# Patient Record
Sex: Male | Born: 1965 | Race: White | Hispanic: No | Marital: Single | Smoking: Current every day smoker
Health system: Southern US, Community
[De-identification: ages and names within clinical notes are randomized; demographics above are authoritative.]

---

## 2009-05-27 ENCOUNTER — Emergency Department (HOSPITAL_COMMUNITY): Admission: EM | Admit: 2009-05-27 | Discharge: 2009-05-28 | Payer: Self-pay | Admitting: Emergency Medicine

## 2013-11-12 ENCOUNTER — Other Ambulatory Visit (HOSPITAL_COMMUNITY): Payer: Self-pay | Admitting: Family Medicine

## 2013-11-12 ENCOUNTER — Ambulatory Visit (HOSPITAL_COMMUNITY)
Admission: RE | Admit: 2013-11-12 | Discharge: 2013-11-12 | Disposition: A | Discharge status: Home / Self Care | Payer: Disability Insurance | Source: Ambulatory Visit | Attending: Family Medicine | Admitting: Family Medicine

## 2013-11-12 DIAGNOSIS — M545 Low back pain, unspecified: Secondary | ICD-10-CM | POA: Insufficient documentation

## 2013-11-12 DIAGNOSIS — M79609 Pain in unspecified limb: Secondary | ICD-10-CM | POA: Insufficient documentation

## 2013-11-12 DIAGNOSIS — S32009A Unspecified fracture of unspecified lumbar vertebra, initial encounter for closed fracture: Secondary | ICD-10-CM | POA: Diagnosis not present

## 2013-11-12 DIAGNOSIS — R52 Pain, unspecified: Secondary | ICD-10-CM

## 2013-11-12 DIAGNOSIS — X58XXXA Exposure to other specified factors, initial encounter: Secondary | ICD-10-CM | POA: Diagnosis not present

## 2013-11-12 DIAGNOSIS — M5137 Other intervertebral disc degeneration, lumbosacral region: Secondary | ICD-10-CM | POA: Insufficient documentation

## 2013-11-12 DIAGNOSIS — M51379 Other intervertebral disc degeneration, lumbosacral region without mention of lumbar back pain or lower extremity pain: Secondary | ICD-10-CM | POA: Insufficient documentation

## 2017-03-27 ENCOUNTER — Emergency Department (HOSPITAL_COMMUNITY): Payer: Self-pay

## 2017-03-27 ENCOUNTER — Encounter (HOSPITAL_COMMUNITY): Payer: Self-pay | Admitting: Cardiology

## 2017-03-27 ENCOUNTER — Emergency Department (HOSPITAL_COMMUNITY)
Admission: EM | Admit: 2017-03-27 | Discharge: 2017-03-27 | Disposition: A | Discharge status: Home / Self Care | Payer: Self-pay | Attending: Emergency Medicine | Admitting: Emergency Medicine

## 2017-03-27 DIAGNOSIS — Y939 Activity, unspecified: Secondary | ICD-10-CM | POA: Insufficient documentation

## 2017-03-27 DIAGNOSIS — F17200 Nicotine dependence, unspecified, uncomplicated: Secondary | ICD-10-CM | POA: Insufficient documentation

## 2017-03-27 DIAGNOSIS — Y999 Unspecified external cause status: Secondary | ICD-10-CM | POA: Insufficient documentation

## 2017-03-27 DIAGNOSIS — S62631B Displaced fracture of distal phalanx of left index finger, initial encounter for open fracture: Secondary | ICD-10-CM

## 2017-03-27 DIAGNOSIS — W231XXA Caught, crushed, jammed, or pinched between stationary objects, initial encounter: Secondary | ICD-10-CM | POA: Insufficient documentation

## 2017-03-27 DIAGNOSIS — Y929 Unspecified place or not applicable: Secondary | ICD-10-CM | POA: Insufficient documentation

## 2017-03-27 DIAGNOSIS — S61211A Laceration without foreign body of left index finger without damage to nail, initial encounter: Secondary | ICD-10-CM | POA: Insufficient documentation

## 2017-03-27 DIAGNOSIS — S62631A Displaced fracture of distal phalanx of left index finger, initial encounter for closed fracture: Secondary | ICD-10-CM | POA: Insufficient documentation

## 2017-03-27 MED ORDER — LIDOCAINE HCL (PF) 2 % IJ SOLN: 10.0000 mL | Freq: Once | INTRAMUSCULAR | Status: DC

## 2017-03-27 MED ORDER — LIDOCAINE HCL (PF) 1 % IJ SOLN
INTRAMUSCULAR | Status: AC
  Filled 2017-03-27: qty 20

## 2017-03-27 MED ORDER — HYDROCODONE-ACETAMINOPHEN 5-325 MG PO TABS: 2.0000 | ORAL_TABLET | ORAL | 0 refills | Status: DC | PRN

## 2017-03-27 MED ORDER — SULFAMETHOXAZOLE-TRIMETHOPRIM 800-160 MG PO TABS
1.0000 | ORAL_TABLET | Freq: Once | ORAL | Status: AC
  Administered 2017-03-27: 1 via ORAL
  Filled 2017-03-27: qty 1

## 2017-03-27 MED ORDER — SULFAMETHOXAZOLE-TRIMETHOPRIM 800-160 MG PO TABS: 1.0000 | ORAL_TABLET | Freq: Two times a day (BID) | ORAL | 0 refills | Status: AC

## 2017-03-27 MED ORDER — HYDROCODONE-ACETAMINOPHEN 5-325 MG PO TABS
2.0000 | ORAL_TABLET | Freq: Once | ORAL | Status: AC
  Administered 2017-03-27: 2 via ORAL
  Filled 2017-03-27: qty 2

## 2017-03-27 NOTE — ED Triage Notes (Signed)
Caught left pointer finger in between 2 pieces of metal.

## 2017-03-27 NOTE — Discharge Instructions (Signed)
See Dr. Romeo AppleHarrison for recheck in 2-3 days.  Suture removal in 8 days

## 2017-03-28 NOTE — ED Provider Notes (Signed)
[facility] EMERGENCY DEPARTMENT Provider Note   CSN: [id number] Arrival date & time: 03/27/17  1559     History   Chief Complaint Chief Complaint  Patient presents with  . Finger Injury    HPI Clarence Sweeney is a 52 y.o. male.  The history is provided by the patient. No language interpreter was used.  Hand Pain  This is a new problem. The current episode started less than 1 hour ago. The problem occurs constantly. The problem has not changed since onset.Nothing aggravates the symptoms. Nothing relieves the symptoms. He has tried nothing for the symptoms. The treatment provided no relief.  Pt crushed finger between 2 pieces of metal.     History reviewed. No pertinent past medical history.  There are no active problems to display for this patient.   History reviewed. No pertinent surgical history.     Home Medications    Prior to Admission medications   Medication Sig Start Date End Date Taking? Authorizing Provider  HYDROcodone-acetaminophen (NORCO/VICODIN) 5-325 MG tablet Take 2 tablets by mouth every 4 (four) hours as needed. 03/27/17   Elson AreasSofia, Raegen Tarpley K, PA-C  sulfamethoxazole-trimethoprim (BACTRIM DS,SEPTRA DS) 800-160 MG tablet Take 1 tablet by mouth 2 (two) times daily for 7 days. 03/27/17 04/03/17  Elson AreasSofia, Hyacinth Marcelli K, PA-C    Family History History reviewed. No pertinent family history.  Social History Social History   Tobacco Use  . Smoking status: Current Every Day Smoker  . Smokeless tobacco: Never Used  Substance Use Topics  . Alcohol use: Yes    Comment: beer  everyday   . Drug use: Yes    Types: Marijuana    Comment: marijuana last night      Allergies   Patient has no known allergies.   Review of Systems Review of Systems  All other systems reviewed and are negative.    Physical Exam Updated Vital Signs BP 137/88   Pulse (!) 110   Temp 98.4 F (36.9 C)   Resp 16   Ht 5\' 11"  (1.803 m)   Wt 79.4 kg (175 lb)   SpO2 97%   BMI 24.41 kg/m     Physical Exam  Constitutional: He appears well-developed and well-nourished.  HENT:  Head: Normocephalic.  Eyes: Pupils are equal, round, and reactive to light.  Musculoskeletal: He exhibits tenderness.  laceration left index finger gapping,  Swollen,  From  Normal sensation   Neurological: He is alert.  Skin: Skin is warm.  Psychiatric: He has a normal mood and affect.  Nursing note and vitals reviewed.    ED Treatments / Results  Labs (all labs ordered are listed, but only abnormal results are displayed) Labs Reviewed - No data to display  EKG  EKG Interpretation None       Radiology Dg Finger Index Left  Result Date: 03/27/2017 CLINICAL DATA:  Crush injury left index finger between 2 pieces of metal today. Initial encounter. EXAM: LEFT INDEX FINGER 2+V COMPARISON:  None. FINDINGS: Comminuted fracture of the distal phalanx of the index finger is identified. The fracture spares the proximal 0.7 cm of the distal phalanx. Main fracture fragment shows slight volar angulation. There is soft tissue gas and irregularity present compatible with an open fracture. IMPRESSION: Comminuted fracture of the distal phalanx of the left index finger spares the proximal 0.7 cm. The fracture appears to be open with soft tissue wound and gas identified. Electronically Signed   By: Drusilla Kannerhomas  Dalessio M.D.   On: 03/27/2017 16:39  Procedures .Marland KitchenLaceration Repair Date/Time: 03/28/2017 4:18 PM Performed by: Elson Areas, PA-C Authorized by: Elson Areas, PA-C   Consent:    Consent obtained:  Verbal   Consent given by:  Patient   Risks discussed:  Infection and need for additional repair   Alternatives discussed:  Referral Anesthesia (see MAR for exact dosages):    Anesthesia method:  Nerve block   Block location:  Digital   Block needle gauge:  27 G   Block anesthetic:  Lidocaine 1% w/o epi   Block injection procedure:  Anatomic landmarks identified Laceration details:    Location:   Finger   Length (cm):  1.4   Depth (mm):  3 Repair type:    Repair type:  Simple Pre-procedure details:    Preparation:  Patient was prepped and draped in usual sterile fashion Exploration:    Wound extent: underlying fracture     Contaminated: no   Treatment:    Area cleansed with:  Betadine   Irrigation solution:  Sterile saline   Irrigation method:  Syringe   Visualized foreign bodies/material removed: no   Skin repair:    Repair method:  Sutures   Suture size:  5-0   Suture technique:  Simple interrupted   Number of sutures:  4 Approximation:    Approximation:  Loose   Vermilion border: well-aligned   Post-procedure details:    Dressing:  Non-adherent dressing   Patient tolerance of procedure:  Tolerated well, no immediate complications   (including critical care time)  Medications Ordered in ED Medications  HYDROcodone-acetaminophen (NORCO/VICODIN) 5-325 MG per tablet 2 tablet (2 tablets Oral Given 03/27/17 1734)  sulfamethoxazole-trimethoprim (BACTRIM DS,SEPTRA DS) 800-160 MG per tablet 1 tablet (1 tablet Oral Given 03/27/17 1734)     Initial Impression / Assessment and Plan / ED Course  I have reviewed the triage vital signs and the nursing notes.  Pertinent labs & imaging results that were available during my care of the patient were reviewed by me and considered in my medical decision making (see chart for details).     MDM  Pt counseled on open fracture and need for antibiotics as well as follow up with Orthopaedist for evaluation.  Pt referred to Dr. Romeo Apple for follow up.  Pt placed in a splint to cover wound.   Final Clinical Impressions(s) / ED Diagnoses   Final diagnoses:  Open displaced fracture of distal phalanx of left index finger, initial encounter    ED Discharge Orders        Ordered    HYDROcodone-acetaminophen (NORCO/VICODIN) 5-325 MG tablet  Every 4 hours PRN     03/27/17 1726    sulfamethoxazole-trimethoprim (BACTRIM DS,SEPTRA DS) 800-160  MG tablet  2 times daily     03/27/17 1726    An After Visit Summary was printed and given to the patient.   Elson Areas, [place] 03/28/17 1621    Mancel Bale, MD 03/29/17 [phone]

## 2017-04-01 ENCOUNTER — Telehealth: Payer: Self-pay | Admitting: Orthopaedic Surgery

## 2017-04-01 NOTE — Telephone Encounter (Signed)
Patient (also had his mother call, today, 04/01/17) requests appointment following Emergency room visit at [facility] on 03/28/17 for problem:  "Open displaced fracture of distal phalanx of left index finger"   - please review and advise.

## 2017-04-02 NOTE — Telephone Encounter (Signed)
Injury almost a week old.  Reasons for delay?? Schedule today.

## 2017-04-02 NOTE — Telephone Encounter (Signed)
Patient is scheduled for appointment tomorrow, 04/03/17.

## 2017-04-02 NOTE — Telephone Encounter (Signed)
Have been trying to reach patient.

## 2017-04-03 ENCOUNTER — Ambulatory Visit (INDEPENDENT_AMBULATORY_CARE_PROVIDER_SITE_OTHER): Payer: Disability Insurance

## 2017-04-03 ENCOUNTER — Ambulatory Visit (INDEPENDENT_AMBULATORY_CARE_PROVIDER_SITE_OTHER): Payer: Self-pay | Admitting: Orthopaedic Surgery

## 2017-04-03 ENCOUNTER — Encounter: Payer: Self-pay | Admitting: Orthopaedic Surgery

## 2017-04-03 VITALS — BP 134/80 | HR 78 | Temp 97.6°F | Ht 71.0 in | Wt 183.0 lb

## 2017-04-03 DIAGNOSIS — S62631B Displaced fracture of distal phalanx of left index finger, initial encounter for open fracture: Secondary | ICD-10-CM

## 2017-04-03 MED ORDER — HYDROCODONE-ACETAMINOPHEN 5-325 MG PO TABS: ORAL_TABLET | ORAL | 0 refills | Status: DC

## 2017-04-03 NOTE — Patient Instructions (Signed)
Steps to Quit Smoking Smoking tobacco can be bad for your health. It can also affect almost every organ in your body. Smoking puts you and people around you at risk for many serious long-lasting (chronic) diseases. Quitting smoking is hard, but it is one of the best things that you can do for your health. It is never too late to quit. What are the benefits of quitting smoking? When you quit smoking, you lower your risk for getting serious diseases and conditions. They can include:  Lung cancer or lung disease.  Heart disease.  Stroke.  Heart attack.  Not being able to have children (infertility).  Weak bones (osteoporosis) and broken bones (fractures).  If you have coughing, wheezing, and shortness of breath, those symptoms may get better when you quit. You may also get sick less often. If you are pregnant, quitting smoking can help to lower your chances of having a baby of low birth weight. What can I do to help me quit smoking? Talk with your doctor about what can help you quit smoking. Some things you can do (strategies) include:  Quitting smoking totally, instead of slowly cutting back how much you smoke over a period of time.  Going to in-person counseling. You are more likely to quit if you go to many counseling sessions.  Using resources and support systems, such as: ? Online chats with a counselor. ? Phone quitlines. ? Printed self-help materials. ? Support groups or group counseling. ? Text messaging programs. ? Mobile phone apps or applications.  Taking medicines. Some of these medicines may have nicotine in them. If you are pregnant or breastfeeding, do not take any medicines to quit smoking unless your doctor says it is okay. Talk with your doctor about counseling or other things that can help you.  Talk with your doctor about using more than one strategy at the same time, such as taking medicines while you are also going to in-person counseling. This can help make  quitting easier. What things can I do to make it easier to quit? Quitting smoking might feel very hard at first, but there is a lot that you can do to make it easier. Take these steps:  Talk to your family and friends. Ask them to support and encourage you.  Call phone quitlines, reach out to support groups, or work with a counselor.  Ask people who smoke to not smoke around you.  Avoid places that make you want (trigger) to smoke, such as: ? Bars. ? Parties. ? Smoke-break areas at work.  Spend time with people who do not smoke.  Lower the stress in your life. Stress can make you want to smoke. Try these things to help your stress: ? Getting regular exercise. ? Deep-breathing exercises. ? Yoga. ? Meditating. ? Doing a body scan. To do this, close your eyes, focus on one area of your body at a time from head to toe, and notice which parts of your body are tense. Try to relax the muscles in those areas.  Download or buy apps on your mobile phone or tablet that can help you stick to your quit plan. There are many free apps, such as QuitGuide from the CDC (Centers for Disease Control and Prevention). You can find more support from smokefree.gov and other websites.  This information is not intended to replace advice given to you by your health care provider. Make sure you discuss any questions you have with your health care provider. Document Released: 12/01/2008 Document   Revised: 10/03/2015 Document Reviewed: 06/21/2014 Elsevier Interactive Patient Education  2018 Elsevier Inc.  

## 2017-04-03 NOTE — Progress Notes (Signed)
Subjective:    Patient ID: Clarence Sweeney, male    DOB: 08-15-1965, 52 y.o.   MRN: [id number]  HPI He was moving a cast iron furnace and he got his finger caught between it and the wall.  He hurt his left index finger.  This happened on 03-27-17.  He went to the ER.  X-rays were done.  He has open comminuted displaced fracture of the distal phalanx.  He had the wound cleansed and closed.  He has been on antibiotics.  He soaks the finger daily.  He has no redness or discharge.  He has no other injury.    I have reviewed the ER records, the x-rays and the x-ray reports.    Review of Systems  Musculoskeletal: Positive for arthralgias.  All other systems reviewed and are negative.  History reviewed. No pertinent past medical history.  History reviewed. No pertinent surgical history.  Current Outpatient Medications on File Prior to Visit  Medication Sig Dispense Refill  . sulfamethoxazole-trimethoprim (BACTRIM DS,SEPTRA DS) 800-160 MG tablet Take 1 tablet by mouth 2 (two) times daily for 7 days. 14 tablet 0  . HYDROcodone-acetaminophen (NORCO/VICODIN) 5-325 MG tablet Take 2 tablets by mouth every 4 (four) hours as needed. (Patient not taking: Reported on 04/03/2017) 10 tablet 0   No current facility-administered medications on file prior to visit.     Social History   Socioeconomic History  . Marital status: Single    Spouse name: Not on file  . Number of children: Not on file  . Years of education: Not on file  . Highest education level: Not on file  Social Needs  . Financial resource strain: Not on file  . Food insecurity - worry: Not on file  . Food insecurity - inability: Not on file  . Transportation needs - medical: Not on file  . Transportation needs - non-medical: Not on file  Occupational History  . Not on file  Tobacco Use  . Smoking status: Current Every Day Smoker  . Smokeless tobacco: Never Used  Substance and Sexual Activity  . Alcohol use: Yes    Comment: beer   everyday   . Drug use: Yes    Types: Marijuana    Comment: marijuana last night   . Sexual activity: Not on file  Other Topics Concern  . Not on file  Social History Narrative  . Not on file    History reviewed. No pertinent family history.  BP 134/80   Pulse 78   Temp 97.6 F (36.4 C)   Ht 5\' 11"  (1.803 m)   Wt 183 lb (83 kg)   BMI 25.52 kg/m      Objective:   Physical Exam  Constitutional: He is oriented to person, place, and time. He appears well-developed and well-nourished.  HENT:  Head: Normocephalic and atraumatic.  Eyes: Conjunctivae and EOM are normal. Pupils are equal, round, and reactive to light.  Neck: Normal range of motion. Neck supple.  Cardiovascular: Normal rate, regular rhythm and intact distal pulses.  Pulmonary/Chest: Effort normal.  Abdominal: Soft.  Musculoskeletal: He exhibits tenderness (Left index finger distally with some swelling, sutures on the dorsum and lateral, no redness, no purulence, hypersensitive, no drainage.).  Neurological: He is alert and oriented to person, place, and time. He has normal reflexes. He displays normal reflexes. No cranial nerve deficit. He exhibits normal muscle tone. Coordination normal.  Skin: Skin is warm and dry.  Psychiatric: He has a normal mood and affect. His  behavior is normal. Judgment and thought content normal.  Vitals reviewed.   X-rays were done of the left index finger, reported separately.      Assessment & Plan:   Encounter Diagnosis  Name Primary?  . Open displaced fracture of distal phalanx of left index finger, initial encounter Yes   A new sterile dressing and finger dressing applied.  Keep it on, do not remove.  Aluminum splint applied.  Return in one week.  I have reviewed the [place] Controlled Substance Reporting System web site prior to prescribing narcotic medicine for this patient.  Electronically Signed Darreld McleanWayne Alvis Edgell, MD 2/14/20198:32 AM

## 2017-04-10 ENCOUNTER — Encounter: Payer: Self-pay | Admitting: Orthopaedic Surgery

## 2017-04-10 ENCOUNTER — Ambulatory Visit: Payer: Self-pay | Admitting: Orthopaedic Surgery

## 2017-04-10 ENCOUNTER — Ambulatory Visit (INDEPENDENT_AMBULATORY_CARE_PROVIDER_SITE_OTHER): Payer: Disability Insurance

## 2017-04-10 VITALS — BP 122/75 | HR 85 | Temp 98.1°F | Ht 71.0 in | Wt 180.0 lb

## 2017-04-10 DIAGNOSIS — S62661D Nondisplaced fracture of distal phalanx of left index finger, subsequent encounter for fracture with routine healing: Secondary | ICD-10-CM

## 2017-04-10 NOTE — Progress Notes (Signed)
CC:  My finger is doing OK  He has been wearing the aluminum splint on the left index finger.  He has no problem.    The finger tip of the left index finger looks good.  There is no redness or drainage.  NV intact.  X-rays were done of the left index finger, reported separately.  Encounter Diagnosis  Name Primary?  . Open nondisplaced fracture of distal phalanx of left index finger with routine healing, subsequent encounter Yes   A new aluminum splint applied as well as new dressing.  Return in two weeks.  X-rays on return.  Call if any problem.  Precautions discussed.   Electronically Signed Darreld McleanWayne Manvi Guilliams, MD 2/21/20193:01 PM

## 2017-04-22 DIAGNOSIS — S62661B Nondisplaced fracture of distal phalanx of left index finger, initial encounter for open fracture: Secondary | ICD-10-CM | POA: Insufficient documentation

## 2017-04-23 ENCOUNTER — Encounter: Payer: Disability Insurance | Admitting: Orthopedic Surgery

## 2017-04-23 ENCOUNTER — Telehealth: Payer: Self-pay | Admitting: Orthopaedic Surgery

## 2017-04-23 ENCOUNTER — Other Ambulatory Visit: Payer: Disability Insurance

## 2017-04-23 NOTE — Telephone Encounter (Signed)
Patient called earlier this afternoon stating he was having trouble getting here for his appt @ 1:40 pm. I explained to him that it was very important to keep this appointment since he has a fracture.  I suggested to him that I could move his appt down to 4:20 pm giving him more time to find someone to bring him. I asked him to please give the office a call by 4 pm if he wasn't going to make it. He agreed to this and again I told him that with a fracture it was very important to keep this appointment, he stated he understood and would give me a call at 4 pm if he couldn't make it. It is now 4:30 pm, I tried calling him and got his voicemail. I left the message for him to call our office as soon as possible so we could reschedule this appointment for him.

## 2017-04-30 ENCOUNTER — Ambulatory Visit: Payer: Self-pay | Admitting: Orthopedic Surgery

## 2017-04-30 ENCOUNTER — Ambulatory Visit (INDEPENDENT_AMBULATORY_CARE_PROVIDER_SITE_OTHER): Payer: Disability Insurance

## 2017-04-30 DIAGNOSIS — S62661D Nondisplaced fracture of distal phalanx of left index finger, subsequent encounter for fracture with routine healing: Secondary | ICD-10-CM

## 2017-04-30 NOTE — Progress Notes (Signed)
Chief Complaint  Patient presents with  . Follow-up    Recheck on left index finger.    Recheck left index finger status post open fracture.    Only problem having now is stiffness.    Finger does not look infected in any way does have stiffness of the PIP and DIP joint.   Encounter Diagnosis  Name Primary?  . Open nondisplaced fracture of distal phalanx of left index finger with routine healing, subsequent encounter 03/27/17 Yes     Recommend soaks to warm the finger up and then active flexion exercises splint can be removed.  Follow-up has not been scheduled follow-up on an as-needed basis

## 2019-10-13 IMAGING — DX DG FINGER INDEX 2+V*L*
3 series · 3 of 3 positions shown · non-contrast
Comparison: None.

CLINICAL DATA: Crush injury left index finger between 2 pieces of
metal today. Initial encounter.

EXAM:
LEFT INDEX FINGER 2+V

[finger ap]
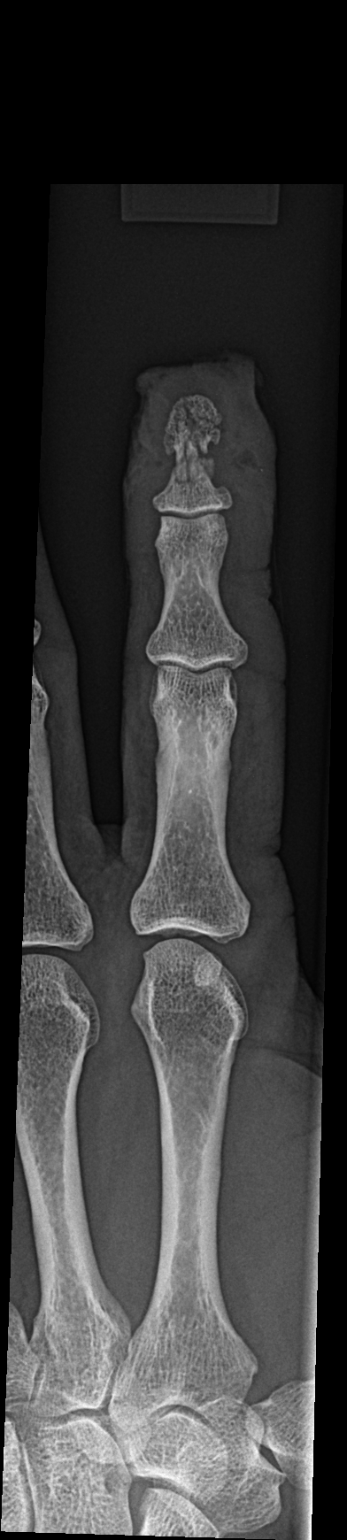

[finger obl]
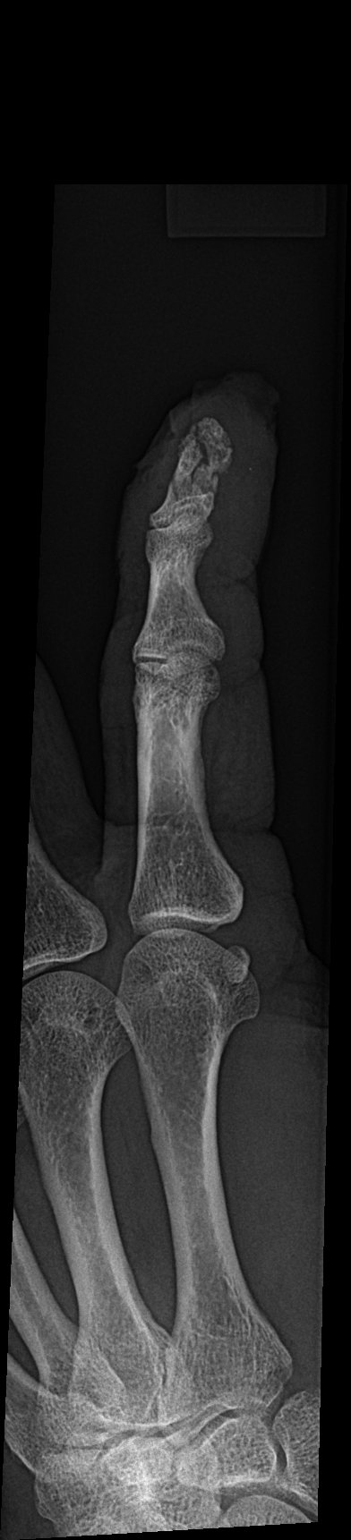

[finger lat]
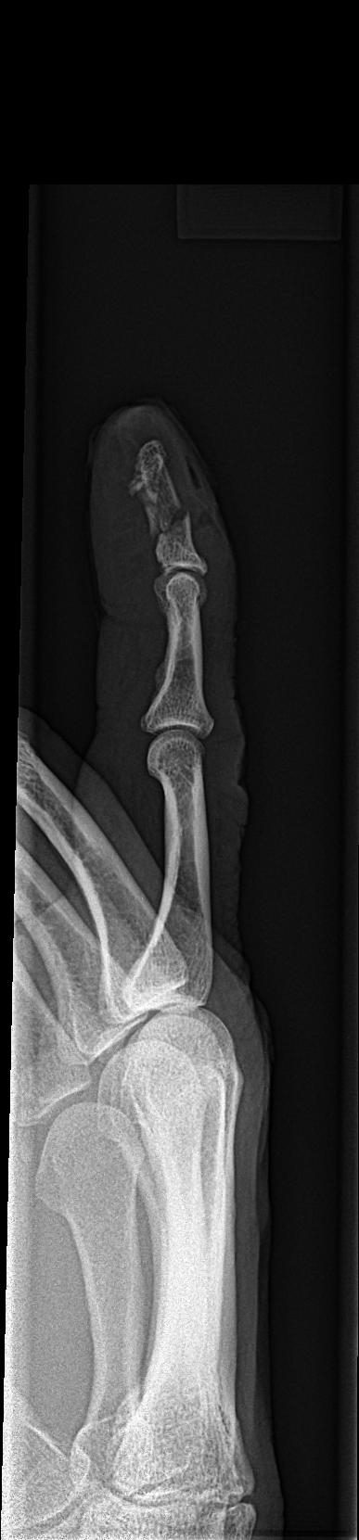

[3 of 3 positions shown; findings below may reference images not displayed]

FINDINGS: Comminuted fracture of the distal phalanx of the index finger is
identified. The fracture spares the proximal 0.7 cm of the distal
phalanx. Main fracture fragment shows slight volar angulation. There
is soft tissue gas and irregularity present compatible with an open
fracture.
IMPRESSION: Comminuted fracture of the distal phalanx of the left index finger
spares the proximal 0.7 cm. The fracture appears to be open with
soft tissue wound and gas identified.

## 2021-11-14 NOTE — Progress Notes (Signed)
No chief complaint on file.

## 2023-04-08 ENCOUNTER — Other Ambulatory Visit: Payer: Self-pay

## 2023-04-08 ENCOUNTER — Ambulatory Visit
Admission: EM | Admit: 2023-04-08 | Discharge: 2023-04-08 | Disposition: A | Discharge status: Home / Self Care | Payer: Self-pay | Attending: Nurse Practitioner | Admitting: Nurse Practitioner

## 2023-04-08 ENCOUNTER — Encounter: Payer: Self-pay | Admitting: Emergency Medicine

## 2023-04-08 DIAGNOSIS — J039 Acute tonsillitis, unspecified: Secondary | ICD-10-CM

## 2023-04-08 MED ORDER — DEXAMETHASONE SODIUM PHOSPHATE 10 MG/ML IJ SOLN
10.0000 mg | Freq: Once | INTRAMUSCULAR | Status: AC
  Administered 2023-04-08: 10 mg via INTRAMUSCULAR

## 2023-04-08 MED ORDER — PENICILLIN G BENZATHINE 1200000 UNIT/2ML IM SUSY
1.2000 10*6.[IU] | PREFILLED_SYRINGE | Freq: Once | INTRAMUSCULAR | Status: AC
  Administered 2023-04-08: 1.2 10*6.[IU] via INTRAMUSCULAR

## 2023-04-08 NOTE — ED Provider Notes (Signed)
 [facility]    CSN: [id number] Arrival date & time: 04/08/23  1528      History   Chief Complaint Chief Complaint  Patient presents with   Ear Pain    HPI Clarence Sweeney is a 58 y.o. male.   Patient presents today with 3 day history of left sided ear pain and sore throat.  He also reports headache and feeling like the left side of his face is swollen.  No fever, body aches, chills, cough, shortness of breath/chest pain.  No abdominal pain, nausea/vomiting or consistent diarrhea.  Reports it hurts to swallow and he has not been able to eat any solid food for 2 days.  He has been able to drink fluids well and swallow his saliva.  Has taken Benadryl and Tylenol for symptoms with minimal improvement.    History reviewed. No pertinent past medical history.  Patient Active Problem List   Diagnosis Date Noted   Open nondisplaced fracture of distal phalanx of left index finger 03/27/17 04/22/2017    History reviewed. No pertinent surgical history.     Home Medications    Prior to Admission medications   Not on File    Family History History reviewed. No pertinent family history.  Social History Social History   Tobacco Use   Smoking status: Every Day   Smokeless tobacco: Never  Substance Use Topics   Alcohol use: Yes    Comment: beer  everyday    Drug use: Yes    Types: Marijuana    Comment: marijuana last night      Allergies   Patient has no known allergies.   Review of Systems Review of Systems Per HPI  Physical Exam Triage Vital Signs ED Triage Vitals  Encounter Vitals Group     BP 04/08/23 1536 (!) 146/97     Systolic BP Percentile --      Diastolic BP Percentile --      Pulse Rate 04/08/23 1536 (!) 106     Resp 04/08/23 1536 20     Temp 04/08/23 1536 100.1 F (37.8 C)     Temp Source 04/08/23 1536 Oral     SpO2 04/08/23 1536 94 %     Weight --      Height --      Head Circumference --      Peak Flow --      Pain Score 04/08/23  1535 10     Pain Loc --      Pain Education --      Exclude from Growth Chart --    No data found.  Updated Vital Signs BP (!) 146/97 (BP Location: Right Arm)   Pulse (!) 106   Temp 100.1 F (37.8 C) (Oral)   Resp 20   SpO2 94%   Visual Acuity Right Eye Distance:   Left Eye Distance:   Bilateral Distance:    Right Eye Near:   Left Eye Near:    Bilateral Near:     Physical Exam Vitals and nursing note reviewed.  Constitutional:      General: He is not in acute distress.    Appearance: He is well-developed. He is not ill-appearing, toxic-appearing or diaphoretic.  HENT:     Head: Normocephalic and atraumatic.     Right Ear: Tympanic membrane and ear canal normal. No drainage, swelling or tenderness. No middle ear effusion. Tympanic membrane is not erythematous.     Left Ear: Tympanic membrane and ear canal normal.  No drainage, swelling or tenderness.  No middle ear effusion. Tympanic membrane is not erythematous.     Nose: No congestion or rhinorrhea.     Mouth/Throat:     Mouth: Mucous membranes are dry.     Pharynx: Pharyngeal swelling and posterior oropharyngeal erythema present. No oropharyngeal exudate or uvula swelling.     Tonsils: No tonsillar exudate or tonsillar abscesses. 0 on the right. 2+ on the left.  Eyes:     Conjunctiva/sclera: Conjunctivae normal.  Cardiovascular:     Rate and Rhythm: Regular rhythm. Tachycardia present.  Pulmonary:     Effort: Pulmonary effort is normal. No respiratory distress.     Breath sounds: Normal breath sounds. No wheezing, rhonchi or rales.  Musculoskeletal:     Cervical back: Neck supple.  Lymphadenopathy:     Cervical: Cervical adenopathy present.  Skin:    General: Skin is warm and dry.     Coloration: Skin is not pale.     Findings: No erythema or rash.  Neurological:     Mental Status: He is alert and oriented to person, place, and time.      UC Treatments / Results  Labs (all labs ordered are listed, but  only abnormal results are displayed) Labs Reviewed - No data to display  EKG   Radiology No results found.  Procedures Procedures (including critical care time)  Medications Ordered in UC Medications  penicillin g benzathine (BICILLIN LA) 1200000 UNIT/2ML injection 1.2 Million Units (1.2 Million Units Intramuscular Given 04/08/23 1620)  dexamethasone (DECADRON) injection 10 mg (10 mg Intramuscular Given 04/08/23 1620)    Initial Impression / Assessment and Plan / UC Course  I have reviewed the triage vital signs and the nursing notes.  Pertinent labs & imaging results that were available during my care of the patient were reviewed by me and considered in my medical decision making (see chart for details).   Patient is mildly hypertensive and slightly tachycardic in triage, otherwise vital signs are stable.    1. Acute tonsillitis, unspecified etiology Concern for early peritonsillar abscess Treat with IM Bicillin 1,200,000 units once, IM Decadron 10 mg IM once Recommended follow-up in ER if symptoms do not improve despite treatment Patient verbalizes understanding, all questions answered  The patient was given the opportunity to ask questions.  All questions answered to their satisfaction.  The patient is in agreement to this plan.   Final Clinical Impressions(s) / UC Diagnoses   Final diagnoses:  Acute tonsillitis, unspecified etiology     Discharge Instructions      We gave you an injection of antibiotic today to help with throat infection.  We also gave you a steroid shot to help with inflammation.  Please make sure you are drinking plenty of fluids.  If the pain worsens and you develop inability to swallow, please seek care in the emergency room.     ED Prescriptions   None    PDMP not reviewed this encounter.   Valentino Nose, NP 04/08/23 [phone]

## 2023-04-08 NOTE — Discharge Instructions (Addendum)
 We gave you an injection of antibiotic today to help with throat infection.  We also gave you a steroid shot to help with inflammation.  Please make sure you are drinking plenty of fluids.  If the pain worsens and you develop inability to swallow, please seek care in the emergency room.

## 2023-04-08 NOTE — ED Triage Notes (Signed)
Pt reports left ear pain and sore throat x 3 days.

## 2023-04-09 ENCOUNTER — Telehealth: Payer: Self-pay | Admitting: Emergency Medicine

## 2023-04-09 NOTE — Telephone Encounter (Signed)
 Pt called and reported throat pain has returned and has tried tylenol, ibuprofen. Pt inquiring about prescription, reviewed chart and discussed providers recommendations of being evaluated in ED. Pt verbalized understanding.

## 2023-04-10 ENCOUNTER — Encounter (HOSPITAL_COMMUNITY): Payer: Self-pay | Admitting: Emergency Medicine

## 2023-04-10 ENCOUNTER — Emergency Department (HOSPITAL_COMMUNITY)
Admission: EM | Admit: 2023-04-10 | Discharge: 2023-04-10 | Disposition: A | Discharge status: Home / Self Care | Payer: Commercial Managed Care - HMO | Attending: Emergency Medicine | Admitting: Emergency Medicine

## 2023-04-10 ENCOUNTER — Emergency Department (HOSPITAL_COMMUNITY): Payer: Commercial Managed Care - HMO

## 2023-04-10 ENCOUNTER — Other Ambulatory Visit: Payer: Self-pay

## 2023-04-10 DIAGNOSIS — R590 Localized enlarged lymph nodes: Secondary | ICD-10-CM | POA: Diagnosis not present

## 2023-04-10 DIAGNOSIS — R1310 Dysphagia, unspecified: Secondary | ICD-10-CM | POA: Diagnosis not present

## 2023-04-10 DIAGNOSIS — Z87891 Personal history of nicotine dependence: Secondary | ICD-10-CM | POA: Diagnosis not present

## 2023-04-10 DIAGNOSIS — J36 Peritonsillar abscess: Secondary | ICD-10-CM

## 2023-04-10 DIAGNOSIS — J029 Acute pharyngitis, unspecified: Secondary | ICD-10-CM | POA: Insufficient documentation

## 2023-04-10 DIAGNOSIS — H9203 Otalgia, bilateral: Secondary | ICD-10-CM | POA: Insufficient documentation

## 2023-04-10 DIAGNOSIS — D72829 Elevated white blood cell count, unspecified: Secondary | ICD-10-CM | POA: Insufficient documentation

## 2023-04-10 LAB — CBC WITH DIFFERENTIAL/PLATELET
Abs Immature Granulocytes: 0.09 10*3/uL — ABNORMAL HIGH (ref 0.00–0.07)
Basophils Absolute: 0.1 10*3/uL (ref 0.0–0.1)
Basophils Relative: 0 %
Eosinophils Absolute: 0 10*3/uL (ref 0.0–0.5)
Eosinophils Relative: 0 %
HCT: 48 % (ref 39.0–52.0)
Hemoglobin: 16.6 g/dL (ref 13.0–17.0)
Immature Granulocytes: 1 %
Lymphocytes Relative: 15 %
Lymphs Abs: 2.4 10*3/uL (ref 0.7–4.0)
MCH: 31 pg (ref 26.0–34.0)
MCHC: 34.6 g/dL (ref 30.0–36.0)
MCV: 89.6 fL (ref 80.0–100.0)
Monocytes Absolute: 1.6 10*3/uL — ABNORMAL HIGH (ref 0.1–1.0)
Monocytes Relative: 10 %
Neutro Abs: 12.4 10*3/uL — ABNORMAL HIGH (ref 1.7–7.7)
Neutrophils Relative %: 74 %
Platelets: 189 10*3/uL (ref 150–400)
RBC: 5.36 MIL/uL (ref 4.22–5.81)
RDW: 13 % (ref 11.5–15.5)
WBC: 16.6 10*3/uL — ABNORMAL HIGH (ref 4.0–10.5)
nRBC: 0 % (ref 0.0–0.2)

## 2023-04-10 LAB — COMPREHENSIVE METABOLIC PANEL
ALT: 45 U/L — ABNORMAL HIGH (ref 0–44)
AST: 25 U/L (ref 15–41)
Albumin: 4.5 g/dL (ref 3.5–5.0)
Alkaline Phosphatase: 88 U/L (ref 38–126)
Anion gap: 13 (ref 5–15)
BUN: 24 mg/dL — ABNORMAL HIGH (ref 6–20)
CO2: 24 mmol/L (ref 22–32)
Calcium: 9.7 mg/dL (ref 8.9–10.3)
Chloride: 102 mmol/L (ref 98–111)
Creatinine, Ser: 1.01 mg/dL (ref 0.61–1.24)
GFR, Estimated: >60 mL/min (ref >60)
Glucose, Bld: 111 mg/dL — ABNORMAL HIGH (ref 70–99)
Potassium: 4.7 mmol/L (ref 3.5–5.1)
Sodium: 139 mmol/L (ref 135–145)
Total Bilirubin: 0.9 mg/dL (ref 0.0–1.2)
Total Protein: 8.4 g/dL — ABNORMAL HIGH (ref 6.5–8.1)

## 2023-04-10 LAB — GROUP A STREP BY PCR: Group A Strep by PCR: NOT DETECTED

## 2023-04-10 MED ORDER — MORPHINE SULFATE (PF) 4 MG/ML IV SOLN: 4.0000 mg | Freq: Once | INTRAVENOUS | Status: DC

## 2023-04-10 MED ORDER — PREDNISONE 20 MG PO TABS: 40.0000 mg | ORAL_TABLET | Freq: Every day | ORAL | 0 refills | Status: AC

## 2023-04-10 MED ORDER — ONDANSETRON HCL 4 MG/2ML IJ SOLN: 4.0000 mg | Freq: Once | INTRAMUSCULAR | Status: DC

## 2023-04-10 MED ORDER — SODIUM CHLORIDE 0.9 % IV SOLN
3.0000 g | Freq: Once | INTRAVENOUS | Status: AC
  Administered 2023-04-10: 3 g via INTRAVENOUS
  Filled 2023-04-10: qty 8

## 2023-04-10 MED ORDER — AMOXICILLIN-POT CLAVULANATE 875-125 MG PO TABS: 1.0000 | ORAL_TABLET | Freq: Two times a day (BID) | ORAL | 0 refills | Status: AC

## 2023-04-10 MED ORDER — IOHEXOL 300 MG/ML  SOLN
80.0000 mL | Freq: Once | INTRAMUSCULAR | Status: AC | PRN
  Administered 2023-04-10: 80 mL via INTRAVENOUS

## 2023-04-10 MED ORDER — DEXAMETHASONE SODIUM PHOSPHATE 10 MG/ML IJ SOLN
10.0000 mg | Freq: Once | INTRAMUSCULAR | Status: AC
Start: 2023-04-10 — End: 2023-04-10
  Administered 2023-04-10: 10 mg via INTRAVENOUS
  Filled 2023-04-10: qty 1

## 2023-04-10 NOTE — ED Notes (Signed)
 ENT filled out consent form, pt signed it & was witnessed by this RN.

## 2023-04-10 NOTE — ED Notes (Signed)
 Patient verbalizes understanding of discharge instructions. Opportunity for questioning and answers were provided. Armband removed by staff, pt discharged from ED. Pt ambulatory to ED waiting room with steady gait.

## 2023-04-10 NOTE — ED Notes (Signed)
[facility] at bedside to transport patient. 

## 2023-04-10 NOTE — Consult Note (Signed)
 Clarence Sweeney is an 58 y.o. male.    Chief Complaint:  sore throat  HPI: 58 year old male with a sore throat starting approximately 1 week ago.  This concurrently worsened particular in the left side and he had some difficulty swallowing.  2 days ago he was seen at the emergency room and was given a shot of steroids and a shot of antibiotics.  After initial temporary improvement he then again worsened and was seen at an outside ER where a CT was done which showed a left peritonsillar abscess and significant uvular swelling.  He was not giving any oral antibiotics or steroids after the recent discharge.  History reviewed. No pertinent past medical history.  History reviewed. No pertinent surgical history.  History reviewed. No pertinent family history.  Social History:  reports that he has been smoking cigarettes. He has never used smokeless tobacco. He reports current alcohol use. He reports current drug use. Drug: Marijuana.  Allergies: No Known Allergies  (Not in a hospital admission)   Results for orders placed or performed during the hospital encounter of 04/10/23 (from the past 48 hours)  Comprehensive metabolic panel     Status: Abnormal   Collection Time: 04/10/23 11:13 AM  Result Value Ref Range   Sodium 139 135 - 145 mmol/L   Potassium 4.7 3.5 - 5.1 mmol/L   Chloride 102 98 - 111 mmol/L   CO2 24 22 - 32 mmol/L   Glucose, Bld 111 (H) 70 - 99 mg/dL    Comment: Glucose reference range applies only to samples taken after fasting for at least 8 hours.   BUN 24 (H) 6 - 20 mg/dL   Creatinine, Ser 1.61 0.61 - 1.24 mg/dL   Calcium 9.7 8.9 - 09.6 mg/dL   Total Protein 8.4 (H) 6.5 - 8.1 g/dL   Albumin 4.5 3.5 - 5.0 g/dL   AST 25 15 - 41 U/L   ALT 45 (H) 0 - 44 U/L   Alkaline Phosphatase 88 38 - 126 U/L   Total Bilirubin 0.9 0.0 - 1.2 mg/dL   GFR, Estimated >04 >54 mL/min    Comment: (NOTE) Calculated using the CKD-EPI Creatinine Equation (2021)    Anion gap 13 5 - 15     Comment: Performed at [facility], [address], [place], [place] [address]  CBC with Differential     Status: Abnormal   Collection Time: 04/10/23 11:13 AM  Result Value Ref Range   WBC 16.6 (H) 4.0 - 10.5 K/uL   RBC 5.36 4.22 - 5.81 MIL/uL   Hemoglobin 16.6 13.0 - 17.0 g/dL   HCT 91.4 78.2 - 95.6 %   MCV 89.6 80.0 - 100.0 fL   MCH 31.0 26.0 - 34.0 pg   MCHC 34.6 30.0 - 36.0 g/dL   RDW 21.3 08.6 - 57.8 %   Platelets 189 150 - 400 K/uL   nRBC 0.0 0.0 - 0.2 %   Neutrophils Relative % 74 %   Neutro Abs 12.4 (H) 1.7 - 7.7 K/uL   Lymphocytes Relative 15 %   Lymphs Abs 2.4 0.7 - 4.0 K/uL   Monocytes Relative 10 %   Monocytes Absolute 1.6 (H) 0.1 - 1.0 K/uL   Eosinophils Relative 0 %   Eosinophils Absolute 0.0 0.0 - 0.5 K/uL   Basophils Relative 0 %   Basophils Absolute 0.1 0.0 - 0.1 K/uL   Immature Granulocytes 1 %   Abs Immature Granulocytes 0.09 (H) 0.00 - 0.07 K/uL    Comment:  Performed at [facility], [address], [place], [place] [address]  Group A Strep by PCR     Status: None   Collection Time: 04/10/23 12:14 PM   Specimen: Throat; Sterile Swab  Result Value Ref Range   Group A Strep by PCR NOT DETECTED NOT DETECTED    Comment: Performed at [facility], [address], [place], [place] [address]   CT Soft Tissue Neck W Contrast Result Date: 04/10/2023 CLINICAL DATA:  Epiglottitis or tonsillitis suspected left peritonsillar swelling in EXAM: CT NECK WITH CONTRAST TECHNIQUE: Multidetector CT imaging of the neck was performed using the standard protocol following the bolus administration of intravenous contrast. RADIATION DOSE REDUCTION: This exam was performed according to the departmental dose-optimization program which includes automated exposure control, adjustment of the mA and/or kV according to patient size and/or use of iterative reconstruction technique. CONTRAST:  80mL OMNIPAQUE IOHEXOL 300 MG/ML  SOLN COMPARISON:  None Available. FINDINGS: Pharynx and larynx:  Extensive edema involving the tonsils with 2.5 x 1.7 x 2.8 cm peripherally enhancing fluid collection in the left tonsil, compatible with tonsillitis and left tonsillar abscess. More ill-defined fluid involving the pharyngeal mucosa with extension inferiorly into the supraglottic larynx and hypopharynx, compatible with cellulitis and phlegmonous change. Edema also extends into the left submandibular region. Salivary glands: Left submandibular edema likely due to the above process. No mass or stone. Thyroid: Normal. Lymph nodes: Mildly prominent upper cervical lymph nodes, likely reactive to the above. Vascular: Limited evaluation due to non arterial timing. Limited intracranial: Negative. Visualized orbits: Negative. Mastoids and visualized paranasal sinuses: Clear. Skeleton: No acute or aggressive process. Upper chest: Visualized lung apices are clear. IMPRESSION: Findings compatible with tonsillitis and large left tonsillar abscess (2.5 x 1.7 x 2.8 cm). More ill-defined fluid involving the pharyngeal mucosa with extension inferiorly into the supraglottic larynx, hypopharynx, and left submandibular region is compatible with cellulitis and phlegmonous change. Electronically Signed   By: Feliberto Harts M.D.   On: 04/10/2023 12:53    ROS: negative other than stated in HPI  Blood pressure (!) 136/94, pulse 86, temperature 98.1 F (36.7 C), temperature source Oral, resp. rate 16, height 5\' 11"  (1.803 m), weight 74.8 kg, SpO2 95%.  PHYSICAL EXAM: General: Hot potato voice Oropharynx: Severe uvular edema and shifting of the uvula to the right.  There is significant edema of the left soft palate.  Procedures  After informed consent was obtained with the patient the left oropharynx was topically anesthetized with benzocaine and injected with 6 cc of 1% lidocaine with 1 100,000 epinephrine.  An incision was made around the lateral aspect of the tonsil on the left side.  During injection a moderate amount of  purulent drainage was expressed.  2 cc of purulence was drained and there was a moderate improvement in the soft palate swelling.  Assessment/Plan Left Peritonsillar abscess -Open incision and drainage of the left peritonsillar abscess was done at bedside today.  He continues to have significant uvular edema and I recommend a dose of IV antibiotics and steroids in the ER.  If he feels improved he could be discharged on 1 week of oral antibiotics and a steroid taper.  If he worsens he should follow-up in the office.    @SHSIG @ 04/10/2023, 5:38 PM

## 2023-04-10 NOTE — ED Provider Notes (Signed)
 Patient seen on arrival after transferring from our affiliated facility due to concern for peritonsillar abscess.  Patient in no distress.  6:50 PM Patient has had drainage of his abscess.  I discussed this case with our ENT colleague, discussed outpatient meds, now, hours after the procedure, no decompensation, no complication, patient discharged in stable condition.   Gerhard Munch, MD 04/10/23 [phone]

## 2023-04-10 NOTE — ED Notes (Signed)
 Family (sister) updated as to patient's status and plan of care. Ok'd by patient.

## 2023-04-10 NOTE — ED Triage Notes (Signed)
 Pt was seen at [facility] 2 days prior and dx with acute tonsillitis. Pt was treated with a steroid shot and sent home. PT reports he is still unable to swallow and is reporting trouble breathing. RR is 16 and regular with 94% o2 in triage. Pt has a smoking history of 1/2 pack a day for 40 years. Pt stated in triage, "This is bad. I can't even smoke a cigarette."

## 2023-04-10 NOTE — ED Notes (Signed)
 ENT at bedside

## 2023-04-10 NOTE — ED Provider Notes (Signed)
[place] EMERGENCY DEPARTMENT AT [facility] Provider Note   CSN: [id number] Arrival date & time: 04/10/23  1028     History  Chief Complaint  Patient presents with   Sore Throat    Ear    Otalgia    Clarence Sweeney is a 58 y.o. male.  Patient is a 58 year old male who presents to the emergency department the chief complaint of worsening sore throat and bilateral ear pain.  Patient notes he was evaluated in urgent care 2 days ago during which time he was given a dose of Bicillin as well as Decadron.  He notes that symptoms did improve that night but notes that they have worsened over the past 24 hours.  He notes that he is having severe pain with swallowing.  He denies any associated drooling.  He has had subjective fevers at home.  He denies any associated chest pain, shortness of breath, abdominal pain, nausea, vomiting, diarrhea.   Sore Throat  Otalgia Associated symptoms: sore throat        Home Medications Prior to Admission medications   Not on File      Allergies    Patient has no known allergies.    Review of Systems   Review of Systems  HENT:  Positive for ear pain and sore throat.   All other systems reviewed and are negative.   Physical Exam Updated Vital Signs BP (!) 142/94 (BP Location: Right Arm)   Pulse 95   Temp 99.8 F (37.7 C) (Oral)   Resp 18   Ht 5\' 11"  (1.803 m)   Wt 74.8 kg   SpO2 94%   BMI 23.01 kg/m  Physical Exam Vitals reviewed.  Constitutional:      Appearance: Normal appearance.  HENT:     Head: Normocephalic and atraumatic.     Right Ear: Tympanic membrane normal.     Left Ear: Tympanic membrane normal.     Nose: Nose normal.     Mouth/Throat:     Mouth: Mucous membranes are moist.     Pharynx: Oropharyngeal exudate and posterior oropharyngeal erythema present.     Tonsils: Tonsillar exudate present.     Comments: Left peritonsillar swelling Eyes:     Extraocular Movements: Extraocular movements intact.      Conjunctiva/sclera: Conjunctivae normal.     Pupils: Pupils are equal, round, and reactive to light.  Cardiovascular:     Rate and Rhythm: Normal rate and regular rhythm.     Pulses: Normal pulses.     Heart sounds: Normal heart sounds.  Pulmonary:     Effort: Pulmonary effort is normal. No respiratory distress.     Breath sounds: Normal breath sounds. No stridor. No wheezing, rhonchi or rales.  Abdominal:     General: Abdomen is flat. Bowel sounds are normal.     Palpations: Abdomen is soft.  Musculoskeletal:        General: Normal range of motion.     Cervical back: Normal range of motion and neck supple.  Lymphadenopathy:     Cervical: Cervical adenopathy present.  Skin:    General: Skin is warm and dry.  Neurological:     General: No focal deficit present.     Mental Status: He is alert and oriented to person, place, and time. Mental status is at baseline.  Psychiatric:        Mood and Affect: Mood normal.        Behavior: Behavior normal.  Thought Content: Thought content normal.        Judgment: Judgment normal.     ED Results / Procedures / Treatments   Labs (all labs ordered are listed, but only abnormal results are displayed) Labs Reviewed  CBC WITH DIFFERENTIAL/PLATELET - Abnormal; Notable for the following components:      Result Value   WBC 16.6 (*)    Neutro Abs 12.4 (*)    Monocytes Absolute 1.6 (*)    Abs Immature Granulocytes 0.09 (*)    All other components within normal limits  GROUP A STREP BY PCR  COMPREHENSIVE METABOLIC PANEL    EKG None  Radiology No results found.  Procedures Procedures    Medications Ordered in ED Medications  dexamethasone (DECADRON) injection 10 mg (has no administration in time range)  Ampicillin-Sulbactam (UNASYN) 3 g in sodium chloride 0.9 % 100 mL IVPB (has no administration in time range)    ED Course/ Medical Decision Making/ A&P                                 Medical Decision Making This patient  presents to the ED for concern of peritonsillar abscess, this involves an extensive number of treatment options, and is a complaint that carries with it a high risk of complications and morbidity.  The differential diagnosis includes acute peritonsillar abscess, Ludwig angina, retropharyngeal abscess, epiglottitis   Co morbidities that complicate the patient evaluation  None   Additional history obtained:  Additional history obtained from medical records External records from outside source obtained and reviewed including records from urgent care   Lab Tests:  I Ordered, and personally interpreted labs.  The pertinent results include: Leukocytosis   Imaging Studies ordered:  I ordered imaging studies including CT scan of neck I independently visualized and interpreted imaging which showed acute left tonsillar abscess I agree with the radiologist interpretation   Consultations Obtained:  I requested consultation with the ENT provider on-call , Dr Sharol Harness,  and discussed lab and imaging findings as well as pertinent plan - they recommend: To ER transfer for needle aspiration   Problem List / ED Course / Critical interventions / Medication management  Patient does remain stable at this time and continues to tolerate his airway.  Did discuss patient case with the ENT provider on-call who does recommend ER to ER transfer at this point for further evaluation and possible needle aspiration.  He has been given Decadron and Unasyn in the emergency department.  He has no active stridor, drooling at this point and does not require any further airway management.  Did discuss patient case with Dr. Andria Meuse at [facility] who has excepted at this time.  I ordered medication including Decadron and Unasyn for tonsillar abscess Reevaluation of the patient after these medicines showed that the patient improved I have reviewed the patients home medicines and have made adjustments as  needed   Social Determinants of Health:  None   Test / Admission - Considered:  Transfer    Amount and/or Complexity of Data Reviewed Labs: ordered. Radiology: ordered.  Risk Prescription drug management.           Final Clinical Impression(s) / ED Diagnoses Final diagnoses:  None    Rx / DC Orders ED Discharge Orders     None         Kathlen Mody 04/10/23 1346    Pricilla Loveless, MD  04/11/23 1552  

## 2023-04-10 NOTE — Discharge Instructions (Signed)
Monitor your condition carefully and do not hesitate to return here for concerning changes in your condition. ?
